# Patient Record
Sex: Male | Born: 1995 | Race: Black or African American | Hispanic: No | Marital: Single | Smoking: Current some day smoker
Health system: Southern US, Community
[De-identification: ages and names within clinical notes are randomized; demographics above are authoritative.]

---
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
## 2017-01-12 ENCOUNTER — Other Ambulatory Visit: Payer: Self-pay

## 2017-01-12 ENCOUNTER — Encounter (HOSPITAL_COMMUNITY): Payer: Self-pay | Admitting: Emergency Medicine

## 2017-01-12 DIAGNOSIS — Y929 Unspecified place or not applicable: Secondary | ICD-10-CM | POA: Insufficient documentation

## 2017-01-12 DIAGNOSIS — S39012A Strain of muscle, fascia and tendon of lower back, initial encounter: Secondary | ICD-10-CM | POA: Insufficient documentation

## 2017-01-12 DIAGNOSIS — Y939 Activity, unspecified: Secondary | ICD-10-CM | POA: Insufficient documentation

## 2017-01-12 DIAGNOSIS — Y999 Unspecified external cause status: Secondary | ICD-10-CM | POA: Insufficient documentation
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
## 2017-01-13 ENCOUNTER — Emergency Department (HOSPITAL_COMMUNITY)
Admission: EM | Admit: 2017-01-13 | Discharge: 2017-01-13 | Disposition: A | Discharge status: Home / Self Care | Payer: Self-pay | Attending: Emergency Medicine | Admitting: Emergency Medicine

## 2017-01-13 DIAGNOSIS — T148XXA Other injury of unspecified body region, initial encounter: Secondary | ICD-10-CM

## 2017-01-13 MED ORDER — IBUPROFEN 800 MG PO TABS
800.0000 mg | ORAL_TABLET | Freq: Once | ORAL | Status: AC
  Administered 2017-01-13: 800 mg via ORAL
  Filled 2017-01-13: qty 1
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
## 2019-01-20 ENCOUNTER — Emergency Department (HOSPITAL_COMMUNITY): Payer: Self-pay

## 2019-01-20 ENCOUNTER — Emergency Department (HOSPITAL_COMMUNITY)
Admission: EM | Admit: 2019-01-20 | Discharge: 2019-01-20 | Disposition: A | Discharge status: Home / Self Care | Payer: Self-pay | Attending: Emergency Medicine | Admitting: Emergency Medicine

## 2019-01-20 ENCOUNTER — Other Ambulatory Visit: Payer: Self-pay

## 2019-01-20 DIAGNOSIS — R05 Cough: Secondary | ICD-10-CM | POA: Insufficient documentation

## 2019-01-20 DIAGNOSIS — Z20822 Contact with and (suspected) exposure to covid-19: Secondary | ICD-10-CM

## 2019-01-20 DIAGNOSIS — Z20828 Contact with and (suspected) exposure to other viral communicable diseases: Secondary | ICD-10-CM | POA: Insufficient documentation

## 2019-01-20 DIAGNOSIS — R0602 Shortness of breath: Secondary | ICD-10-CM | POA: Insufficient documentation

## 2019-01-20 LAB — POC SARS CORONAVIRUS 2 AG -  ED: SARS Coronavirus 2 Ag: NEGATIVE

## 2019-01-20 NOTE — ED Provider Notes (Signed)
[facility] DEPT Provider Note   CSN: [id number] Arrival date & time: 01/20/19  7169     History   Chief Complaint Chief Complaint  Patient presents with  . Shortness of Breath    HPI Frank Brennan is a 23 y.o. male no known past medical history presents to emergency department today with chief complaint of shortness of breath x days.  He states he was recently around his friend who tested positive for Covid.  He is also endorsing a nonproductive cough.  He has not taken any medications for symptoms prior to arrival.  He admits to feeling short of breath after long coughing episodes.  He is requesting Covid test today.  Denies fever, chills, congestion, chest pain, abdominal pain, nausea, vomiting, urinary symptoms, diarrhea, rash, neck pain.   No past medical history on file.  There are no active problems to display for this patient.   No past surgical history on file.      Home Medications    Prior to Admission medications   Not on File    Family History No family history on file.  Social History Social History   Tobacco Use  . Smoking status: Never Smoker  . Smokeless tobacco: Never Used  Substance Use Topics  . Alcohol use: Yes    Comment: socially  . Drug use: No     Allergies   Patient has no known allergies.   Review of Systems Review of Systems All other systems are reviewed and are negative for acute change except as noted in the HPI.   Physical Exam Updated Vital Signs BP (!) 142/90 (BP Location: Left Arm)   Pulse 94   Temp 97.9 F (36.6 C) (Oral)   Resp 17   SpO2 97%   Physical Exam Vitals signs and nursing note reviewed.  Constitutional:      General: He is not in acute distress.    Appearance: He is not ill-appearing.  HENT:     Head: Normocephalic and atraumatic.     Right Ear: Tympanic membrane and external ear normal.     Left Ear: Tympanic membrane and external ear normal.     Nose: Nose  normal.     Mouth/Throat:     Mouth: Mucous membranes are moist.     Pharynx: Oropharynx is clear.  Eyes:     General: No scleral icterus.       Right eye: No discharge.        Left eye: No discharge.     Conjunctiva/sclera: Conjunctivae normal.  Neck:     Musculoskeletal: Normal range of motion.     Vascular: No JVD.  Cardiovascular:     Rate and Rhythm: Normal rate and regular rhythm.     Pulses: Normal pulses.          Radial pulses are 2+ on the right side and 2+ on the left side.     Heart sounds: Normal heart sounds.  Pulmonary:     Comments: Lungs clear to auscultation in all fields. Symmetric chest rise. No wheezing, rales, or rhonchi.  He is speaking in full sentences, no accessory muscle use.  SPO2 is 97% on room air. Chest:     Chest wall: No tenderness.  Abdominal:     Comments: Abdomen is soft, non-distended, and non-tender in all quadrants. No rigidity, no guarding. No peritoneal signs.  Musculoskeletal: Normal range of motion.  Skin:    General: Skin is warm and dry.  Capillary Refill: Capillary refill takes less than 2 seconds.  Neurological:     Mental Status: He is oriented to person, place, and time.     GCS: GCS eye subscore is 4. GCS verbal subscore is 5. GCS motor subscore is 6.     Comments: Fluent speech, no facial droop.  Psychiatric:        Behavior: Behavior normal.      ED Treatments / Results  Labs (all labs ordered are listed, but only abnormal results are displayed) Labs Reviewed  NOVEL CORONAVIRUS, NAA (HOSP ORDER, SEND-OUT TO REF LAB; TAT 18-24 HRS)  POC SARS CORONAVIRUS 2 AG -  ED    EKG None  Radiology Dg Chest Portable 1 View  Result Date: 01/20/2019 CLINICAL DATA:  Cough.  Shortness of breath.  Recent COVID exposure. EXAM: PORTABLE CHEST 1 VIEW COMPARISON:  None. FINDINGS: The heart size and mediastinal contours are within normal limits. Both lungs are clear. The visualized skeletal structures are unremarkable. IMPRESSION: No  active disease. Electronically Signed   By: Danae Orleans M.D.   On: 01/20/2019 11:19    Procedures Procedures (including critical care time)  Medications Ordered in ED Medications - No data to display   Initial Impression / Assessment and Plan / ED Course  I have reviewed the triage vital signs and the nursing notes.  Pertinent labs & imaging results that were available during my care of the patient were reviewed by me and considered in my medical decision making (see chart for details).  I have reviewed patient's EMR to obtain pertinent PMH to assist in MDM.  Symptoms and exam most suggestive of uncomplicated viral illness. DDX incluldes viral URI/LRI, COVID-19.  No travel. No known exposures to confirmed COVID-19.    Exam is benign.  Normal WOB. No fever, tachypnea, tachycardia, hypoxemia. Lungs are CTAB. I viewed pt's chest xray and it does not suggest acute infectious processes He has no significant h/o immunocompromise. Doubt bacterial bronchitis or pneumonia.  No signs or symptoms to suggest strep pharyngitis.  No clinical signs of severe illness, dehydration, to warrant further emergent work up in ER.  Patient ambulated without respiratory distress, SpO2 stayed above 95% on room air during ambulation trial. POC Covid test is negative.  Discussed with patient this could be false negative and send out test was performed.  Patient aware he needs to self quarantine until he has the test results.  Given reassuring physical exam, symptoms, will discharge with symptomatic treatment. Recommended PCP f/u in the next 2-3 days for persistent symptoms. Self-isolation instructions discussed. Pt was given home self-isolation instructions and instructions for family members. Pt understands signs and symptoms that would warrant return to ED.  Pt comfortable and agreeable with POC.   Portions of this note were generated with Scientist, clinical (histocompatibility and immunogenetics). Dictation errors may occur despite best attempts  at proofreading.   Final Clinical Impressions(s) / ED Diagnoses   Final diagnoses:  Shortness of breath  Close exposure to COVID-19 virus    ED Discharge Orders    None       Kathyrn Lass 01/20/19 1212    Terrilee Files, MD 01/20/19 1721

## 2019-01-20 NOTE — ED Triage Notes (Signed)
Pt presents with c/o request for Covid test. Pt reports he was around his friend recently who just tested positive for Covid. Pt reports some shortness of breath and a non-productive cough. Pt denies any pain.

## 2019-01-20 NOTE — Discharge Instructions (Addendum)
Thank you for allowing [place] to care for you today.   Please return to the emergency department if you have any new or worsening symptoms.  Your rapid Covid test today was negative.  As we discussed this could be a false negative.  A send out PCR test was performed and the result would be available in 24 hours.  You should continue to self isolate and self quarantine until you have the test result.   If you test positive:  Medications- You can take medications to help treat your symptoms: -Tylenol for fever and body aches. Please take as prescribed on the bottle. -Over the coutner cough medicine such as mucinex, robitussin, or other brands. -Flonase for nasal congestion  Treatment- This is a virus and unfortunately there are no antibitotics approved to treat this virus at this time. It is important to monitor your symptoms closely: -You should have a theremometer at home to check your temperature when feeling feverish. -Use a pulse ox meter to measure your oxygen when feeling short of breath.  -If your fever is over 100.4 despite taking tylenol or if your oxygen level drops below 94% these are reasons to rturn to the emergency department for further evaluation. Please call the emergency department before you come to make [place] aware.    We recommend you self-isolate for 10 days and to inform your work/family/friends that you has the virus.  They will need to self-quarantine for 14 days to monitor for symptoms.    Again: symptoms of shortnessf breath, chest pain, difficulty breathing, new onset of confuison, any symptoms that are concerning. And you or the person should come to emergency department for evaluation.   I hope you feel better soon

## 2019-01-20 NOTE — ED Notes (Signed)
Patient ambulated in room, oxygen saturation ranged from 95-99% on room air, no shortness of breath, no issues with ambulation.

## 2019-01-21 LAB — NOVEL CORONAVIRUS, NAA (HOSP ORDER, SEND-OUT TO REF LAB; TAT 18-24 HRS): SARS-CoV-2, NAA: NOT DETECTED

## 2019-09-23 ENCOUNTER — Ambulatory Visit
Admission: EM | Admit: 2019-09-23 | Discharge: 2019-09-23 | Disposition: A | Discharge status: Home / Self Care | Payer: Self-pay | Attending: Emergency Medicine | Admitting: Emergency Medicine

## 2019-09-23 ENCOUNTER — Other Ambulatory Visit: Payer: Self-pay

## 2019-09-23 DIAGNOSIS — Z113 Encounter for screening for infections with a predominantly sexual mode of transmission: Secondary | ICD-10-CM | POA: Insufficient documentation

## 2019-09-23 DIAGNOSIS — N5089 Other specified disorders of the male genital organs: Secondary | ICD-10-CM | POA: Insufficient documentation

## 2019-09-23 NOTE — Discharge Instructions (Addendum)
STI testing pending: No intercourse for the next 2 days. We will treat if indicated. Very important to follow-up with PCP for further evaluation. Return sooner for sudden onset of testicle swelling or pain, discharge.

## 2019-09-23 NOTE — ED Provider Notes (Signed)
[facility]    CSN: [id number] Arrival date & time: 09/23/19  0907      History   Chief Complaint Chief Complaint  Patient presents with  . knot on lt testicle    HPI Frank Brennan is a 24 y.o. male presenting for left testicle not.  States he noticed it last night.  No family or personal history of testicular cancer.  Denies testicle pain, penile pain or discharge, change in urinary or bowel habit.  No fever.  Patient currently sexually active with 1 male partner.   History reviewed. No pertinent past medical history.  There are no problems to display for this patient.   History reviewed. No pertinent surgical history.     Home Medications    Prior to Admission medications   Not on File    Family History History reviewed. No pertinent family history.  Social History Social History   Tobacco Use  . Smoking status: Never Smoker  . Smokeless tobacco: Never Used  Substance Use Topics  . Alcohol use: Yes    Comment: socially  . Drug use: No     Allergies   Patient has no known allergies.   Review of Systems As per HPI   Physical Exam Triage Vital Signs ED Triage Vitals  Enc Vitals Group     BP      Pulse      Resp      Temp      Temp src      SpO2      Weight      Height      Head Circumference      Peak Flow      Pain Score      Pain Loc      Pain Edu?      Excl. in GC?    No data found.  Updated Vital Signs BP (!) 151/107 (BP Location: Left Arm)   Pulse 77   Temp 98.1 F (36.7 C) (Oral)   Resp 18   SpO2 97%   Visual Acuity Right Eye Distance:   Left Eye Distance:   Bilateral Distance:    Right Eye Near:   Left Eye Near:    Bilateral Near:     Physical Exam Constitutional:      General: He is not in acute distress. HENT:     Head: Normocephalic and atraumatic.  Eyes:     General: No scleral icterus.    Pupils: Pupils are equal, round, and reactive to light.  Cardiovascular:     Rate and Rhythm:  Normal rate.  Pulmonary:     Effort: Pulmonary effort is normal. No respiratory distress.     Breath sounds: No wheezing.  Genitourinary:    Penis: Normal.      Comments: Testes symmetric bilaterally, nontender.  Patient does have < 1 cm nodule superiorly.  Nontender, nonmobile. Skin:    Coloration: Skin is not jaundiced or pale.  Neurological:     Mental Status: He is alert and oriented to person, place, and time.      UC Treatments / Results  Labs (all labs ordered are listed, but only abnormal results are displayed) Labs Reviewed  CYTOLOGY, (ORAL, ANAL, URETHRAL) ANCILLARY ONLY    EKG   Radiology No results found.  Procedures Procedures (including critical care time)  Medications Ordered in UC Medications - No data to display  Initial Impression / Assessment and Plan / UC Course  I have reviewed  the triage vital signs and the nursing notes.  Pertinent labs & imaging results that were available during my care of the patient were reviewed by me and considered in my medical decision making (see chart for details).     Patient appears well in office.  Cytology pending: We will treat if indicated.  Will have patient follow with PCP for outpatient evaluation/imaging.  Return precautions discussed, pt verbalized understanding and is agreeable to plan. Final Clinical Impressions(s) / UC Diagnoses   Final diagnoses:  Screening examination for venereal disease  Mass of left testicle     Discharge Instructions     STI testing pending: No intercourse for the next 2 days. We will treat if indicated. Very important to follow-up with PCP for further evaluation. Return sooner for sudden onset of testicle swelling or pain, discharge.    ED Prescriptions    None     PDMP not reviewed this encounter.   Hall[place]Potvin, [place], [place] 09/23/19 1011

## 2019-09-23 NOTE — ED Triage Notes (Signed)
Pt states noticed a knot to lt testicle last night. Denies pain or urinary difficulties.

## 2019-09-24 LAB — CYTOLOGY, (ORAL, ANAL, URETHRAL) ANCILLARY ONLY
Chlamydia: NEGATIVE
Comment: NEGATIVE
Comment: NEGATIVE
Comment: NORMAL
Neisseria Gonorrhea: NEGATIVE
Trichomonas: NEGATIVE

## 2019-11-11 ENCOUNTER — Ambulatory Visit: Admit: 2019-11-11 | Discharge status: Absent without leave | Payer: Self-pay

## 2019-11-22 NOTE — Patient Instructions (Signed)
Thank you for choosing Primary Care at [facility] to be your medical home!    Frank Brennan was seen by De Hollingshead, DO today.   Frank Brennan's primary care provider is Marcy Siren, DO.   For the best care possible, you should try to see Marcy Siren, DO whenever you come to the clinic.   We look forward to seeing you again soon!  If you have any questions about your visit today, please call us at [phone] or feel free to reach your primary care provider via MyChart.

## 2019-11-25 ENCOUNTER — Telehealth (INDEPENDENT_AMBULATORY_CARE_PROVIDER_SITE_OTHER): Payer: 59 | Admitting: Internal Medicine

## 2019-11-25 ENCOUNTER — Encounter: Payer: Self-pay | Admitting: Internal Medicine

## 2019-11-25 DIAGNOSIS — N5089 Other specified disorders of the male genital organs: Secondary | ICD-10-CM | POA: Diagnosis not present

## 2019-11-25 DIAGNOSIS — Z7689 Persons encountering health services in other specified circumstances: Secondary | ICD-10-CM | POA: Diagnosis not present

## 2019-11-25 NOTE — Progress Notes (Signed)
Virtual Visit via Telephone Note  I connected with Frank Brennan, on 11/25/2019 at 10:11 AM by telephone due to the COVID-19 pandemic and verified that I am speaking with the correct person using two identifiers.   Consent: I discussed the limitations, risks, security and privacy concerns of performing an evaluation and management service by telephone and the availability of in person appointments. I also discussed with the patient that there may be a patient responsible charge related to this service. The patient expressed understanding and agreed to proceed.   Location of Patient: Home   Location of Provider: Clinic   Persons participating in Telemedicine visit: Reinhardt Ronn Smolinsky [facility] Dr. Earlene Plater   History of Present Illness: Patient has a visit to establish care. 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 No lab work was completed at this time.   No PMH. No surgical history. Not currently taking any medications.   Patient reports has concerns about a scrotum knot. Feels like its in the inside and attached to his testicle. Caught his attention 3 months ago. Feels like it has gotten smaller but is still there. Not painful.    No past medical history on file. No Known Allergies  No current outpatient medications on file prior to visit.   No current facility-administered medications on file prior to visit.    Observations/Objective: NAD. Speaking clearly.  Work of breathing normal.  Alert and oriented. Mood appropriate.   Assessment and Plan: 1. Encounter to establish care Reviewed patient's PMH, social history, surgical history, and medications.  Is overdue for annual exam, screening blood work, and health maintenance topics. Have asked patient to return for visit to address these items.   2. Scrotal mass Non-acute scrotal mass warrants work up with ultrasound to distinguish amongst common  causes of non-painful scrotal masses.  - US Scrotum; Future   Follow Up Instructions: Ultrasound and annual exam    I discussed the assessment and treatment plan with the patient. The patient was provided an opportunity to ask questions and all were answered. The patient agreed with the plan and demonstrated an understanding of the instructions.   The patient was advised to call back or seek an in-person evaluation if the symptoms worsen or if the condition fails to improve as anticipated.     I provided 14 minutes total of non-face-to-face time during this encounter including median intraservice time, reviewing previous notes, investigations, ordering medications, medical decision making, coordinating care and patient verbalized understanding at the end of the visit.    Marcy Siren, D.O. Primary Care at [facility]  11/25/2019, 10:11 AM

## 2019-12-03 ENCOUNTER — Other Ambulatory Visit: Payer: 59

## 2019-12-13 ENCOUNTER — Other Ambulatory Visit: Payer: 59

## 2019-12-27 ENCOUNTER — Other Ambulatory Visit: Payer: 59

## 2019-12-30 ENCOUNTER — Ambulatory Visit
Admission: RE | Admit: 2019-12-30 | Discharge: 2019-12-30 | Disposition: A | Discharge status: Home / Self Care | Payer: 59 | Source: Ambulatory Visit | Attending: Internal Medicine | Admitting: Internal Medicine

## 2019-12-30 DIAGNOSIS — N5089 Other specified disorders of the male genital organs: Secondary | ICD-10-CM

## 2020-05-21 ENCOUNTER — Other Ambulatory Visit: Payer: Self-pay

## 2020-05-21 ENCOUNTER — Ambulatory Visit
Admission: RE | Admit: 2020-05-21 | Discharge: 2020-05-21 | Disposition: A | Discharge status: Home / Self Care | Payer: Self-pay | Source: Ambulatory Visit | Attending: Physician Assistant | Admitting: Physician Assistant

## 2020-05-21 VITALS — BP 136/81 | HR 73 | Temp 98.2°F | Resp 22

## 2020-05-21 DIAGNOSIS — J0100 Acute maxillary sinusitis, unspecified: Secondary | ICD-10-CM

## 2020-05-21 DIAGNOSIS — R0981 Nasal congestion: Secondary | ICD-10-CM

## 2020-05-21 MED ORDER — CETIRIZINE HCL 10 MG PO TABS: 10.0000 mg | ORAL_TABLET | Freq: Every day | ORAL | 0 refills | Status: DC

## 2020-05-21 MED ORDER — AMOXICILLIN-POT CLAVULANATE 875-125 MG PO TABS: 1.0000 | ORAL_TABLET | Freq: Two times a day (BID) | ORAL | 0 refills | Status: DC

## 2020-05-21 MED ORDER — FLUTICASONE PROPIONATE 50 MCG/ACT NA SUSP
1.0000 | Freq: Every day | NASAL | 0 refills | Status: DC
Start: 2020-05-21 — End: 2022-02-08

## 2020-05-21 NOTE — ED Triage Notes (Signed)
Pt c/o lt nare congested and swelling for over a month.

## 2020-05-21 NOTE — ED Provider Notes (Signed)
[facility]    CSN: [id number] Arrival date & time: 05/21/20  1706      History   Chief Complaint Chief Complaint  Patient presents with  . appt 5  . Nasal Congestion    HPI Frank Brennan is a 25 y.o. male.   Frank Brennan presents today with a month long history of nasal congestion. He reports associated sinus pressure, drainage, headache. Denies any fever, cough, nausea/ vomiting, chest pain, shortness of breath. He has a history of allergies but has not been taking medication recently. He denies recent illness. He denies history of asthma or COPD. He has not tried any over the counter medications. Denies recent antibiotic use. He has not had flu or COVID vaccinations. He has not seen ENT in the past. Reports symptoms are primarily on the left side and he presents for evaluation as they are worsening.      History reviewed. No pertinent past medical history.  There are no problems to display for this patient.   History reviewed. No pertinent surgical history.     Home Medications    Prior to Admission medications   Medication Sig Start Date End Date Taking? Authorizing Provider  amoxicillin-clavulanate (AUGMENTIN) 875-125 MG tablet Take 1 tablet by mouth every 12 (twelve) hours. 05/21/20  Yes Quenisha Lovins, Noberto Retort, PA-C  cetirizine (ZYRTEC ALLERGY) 10 MG tablet Take 1 tablet (10 mg total) by mouth daily. 05/21/20  Yes Denean Pavon K, PA-C  fluticasone (FLONASE) 50 MCG/ACT nasal spray Place 1 spray into both nostrils daily. 05/21/20  Yes Aiana Nordquist, Noberto Retort, PA-C    Family History History reviewed. No pertinent family history.  Social History Social History   Tobacco Use  . Smoking status: Never Smoker  . Smokeless tobacco: Never Used  Substance Use Topics  . Alcohol use: Yes    Comment: socially  . Drug use: No     Allergies   Patient has no known allergies.   Review of Systems Review of Systems  Constitutional: Negative for activity change, appetite  change, fatigue and fever.  HENT: Positive for congestion, postnasal drip and sinus pressure. Negative for sneezing and sore throat.   Respiratory: Negative for cough and shortness of breath.   Cardiovascular: Negative for chest pain.  Gastrointestinal: Negative for abdominal pain, diarrhea, nausea and vomiting.  Neurological: Positive for headaches. Negative for dizziness and light-headedness.     Physical Exam Triage Vital Signs ED Triage Vitals  Enc Vitals Group     BP 05/21/20 1719 136/81     Pulse Rate 05/21/20 1719 73     Resp 05/21/20 1719 (!) 22     Temp 05/21/20 1719 98.2 F (36.8 C)     Temp Source 05/21/20 1719 Oral     SpO2 05/21/20 1719 94 %     Weight --      Height --      Head Circumference --      Peak Flow --      Pain Score 05/21/20 1736 0     Pain Loc --      Pain Edu? --      Excl. in GC? --    No data found.  Updated Vital Signs BP 136/81 (BP Location: Right Arm)   Pulse 73   Temp 98.2 F (36.8 C) (Oral)   Resp (!) 22   SpO2 94%   Visual Acuity Right Eye Distance:   Left Eye Distance:   Bilateral Distance:    Right Eye  Near:   Left Eye Near:    Bilateral Near:     Physical Exam Vitals reviewed.  Constitutional:      General: He is awake.     Appearance: Normal appearance. He is normal weight. He is not ill-appearing.     Comments: Very pleasant male appears stated age in no acute distress.   HENT:     Head: Normocephalic and atraumatic.     Right Ear: Tympanic membrane, ear canal and external ear normal. Tympanic membrane is not erythematous or bulging.     Left Ear: Tympanic membrane, ear canal and external ear normal. Tympanic membrane is not erythematous or bulging.     Nose: Mucosal edema and congestion present. No rhinorrhea.     Left Nostril: Occlusion present. No foreign body.     Right Sinus: No maxillary sinus tenderness or frontal sinus tenderness.     Left Sinus: Maxillary sinus tenderness present. No frontal sinus  tenderness.     Mouth/Throat:     Pharynx: Uvula midline. No posterior oropharyngeal erythema or uvula swelling.  Cardiovascular:     Rate and Rhythm: Normal rate and regular rhythm.     Heart sounds: No murmur heard.   Pulmonary:     Effort: Pulmonary effort is normal. No accessory muscle usage or respiratory distress.     Breath sounds: Normal breath sounds. No stridor. No wheezing, rhonchi or rales.     Comments: Clear to auscultation bilaterally  Lymphadenopathy:     Head:     Right side of head: No submental, submandibular or tonsillar adenopathy.     Left side of head: No submental, submandibular or tonsillar adenopathy.     Cervical: No cervical adenopathy.  Neurological:     Mental Status: He is alert.  Psychiatric:        Behavior: Behavior is cooperative.      UC Treatments / Results  Labs (all labs ordered are listed, but only abnormal results are displayed) Labs Reviewed - No data to display  EKG   Radiology No results found.  Procedures Procedures (including critical care time)  Medications Ordered in UC Medications - No data to display  Initial Impression / Assessment and Plan / UC Course  I have reviewed the triage vital signs and the nursing notes.  Pertinent labs & imaging results that were available during my care of the patient were reviewed by me and considered in my medical decision making (see chart for details).     Patient started on Augmentin given prolonged and worsening symptoms. Recommended he use over the counter medication for additional symptoms including antihistamine, Flonase, Mucinex. He does not need a work note. Discussed that if symptoms persist we would need to consider seeing ENT and/or imaging such as CT sinus but this would need to be arranged by his PCP. Strict return precautions given to which patient expressed understanding.   Final Clinical Impressions(s) / UC Diagnoses   Final diagnoses:  Acute non-recurrent maxillary  sinusitis  Nasal congestion     Discharge Instructions     Take medication as prescribed. I would also recommend Mucinex. Make sure you are drinking plenty of water. If anything worsens please come see [place] again. If you are not getting better soon I would reach out to your PCP to consider ENT referral as we discussed.     ED Prescriptions    Medication Sig Dispense Auth. Provider   amoxicillin-clavulanate (AUGMENTIN) 875-125 MG tablet Take 1 tablet by mouth every  12 (twelve) hours. 14 tablet Verba Ainley K, PA-C   fluticasone (FLONASE) 50 MCG/ACT nasal spray Place 1 spray into both nostrils daily. 16 g Dmiya Malphrus K, PA-C   cetirizine (ZYRTEC ALLERGY) 10 MG tablet Take 1 tablet (10 mg total) by mouth daily. 30 tablet Clemmie Buelna, Noberto Retort, PA-C     PDMP not reviewed this encounter.   Jeani Hawking, PA-C 05/21/20 1803

## 2020-05-21 NOTE — Discharge Instructions (Addendum)
Take medication as prescribed. I would also recommend Mucinex. Make sure you are drinking plenty of water. If anything worsens please come see [place] again. If you are not getting better soon I would reach out to your PCP to consider ENT referral as we discussed.

## 2022-02-08 ENCOUNTER — Ambulatory Visit
Admission: RE | Admit: 2022-02-08 | Discharge: 2022-02-08 | Disposition: A | Discharge status: Home / Self Care | Payer: Self-pay | Source: Ambulatory Visit | Attending: Family Medicine | Admitting: Family Medicine

## 2022-02-08 VITALS — BP 129/83 | HR 74 | Temp 97.5°F | Resp 16

## 2022-02-08 DIAGNOSIS — Z113 Encounter for screening for infections with a predominantly sexual mode of transmission: Secondary | ICD-10-CM | POA: Insufficient documentation

## 2022-02-08 NOTE — ED Triage Notes (Signed)
Pt presents with request for sti testing no symptoms or known exposures

## 2022-02-08 NOTE — Discharge Instructions (Signed)
We have sent testing for sexually transmitted infections. We will notify you of any positive results once they are received. If required, we will prescribe any medications you might need.  Please refrain from all sexual activity for at least the next seven days.  

## 2022-02-08 NOTE — ED Provider Notes (Signed)
  [facility]   [id number] 02/08/22 Arrival Time: 1249  ASSESSMENT & PLAN:  1. Screening for STDs (sexually transmitted diseases)       Discharge Instructions      We have sent testing for sexually transmitted infections. We will notify you of any positive results once they are received. If required, we will prescribe any medications you might need.  Please refrain from all sexual activity for at least the next seven days.     Pending: Labs Reviewed  RPR  HIV ANTIBODY (ROUTINE TESTING W REFLEX)  CYTOLOGY, (ORAL, ANAL, URETHRAL) ANCILLARY ONLY    Will notify of any positive results. Instructed to refrain from sexual activity for at least seven days.  Reviewed expectations re: course of current medical issues. Questions answered. Outlined signs and symptoms indicating need for more acute intervention. Patient verbalized understanding. After Visit Summary given.   SUBJECTIVE:  Frank Brennan is a 26 y.o. male who requests STD screening.  No abdominal or pelvic pain. No n/v. No rashes or lesions. Reports that he is sexually active with multiple male partners (2).  OBJECTIVE:  Vitals:   02/08/22 1358  BP: 129/83  Pulse: 74  Resp: 16  Temp: (!) 97.5 F (36.4 C)  TempSrc: Oral  SpO2: 98%     General appearance: alert, cooperative, appears stated age and no distress GU: deferred Skin: warm and dry Psychological: alert and cooperative; normal mood and affect.    Labs Reviewed  RPR  HIV ANTIBODY (ROUTINE TESTING W REFLEX)  CYTOLOGY, (ORAL, ANAL, URETHRAL) ANCILLARY ONLY    No Known Allergies  History reviewed. No pertinent past medical history. History reviewed. No pertinent family history. Social History   Socioeconomic History   Marital status: Single    Spouse name: Not on file   Number of children: Not on file   Years of education: Not on file   Highest education level: Not on file  Occupational History   Not on file  Tobacco  Use   Smoking status: Some Days    Types: Cigars    Passive exposure: Never   Smokeless tobacco: Never  Vaping Use   Vaping Use: Never used  Substance and Sexual Activity   Alcohol use: Not Currently    Alcohol/week: 2.0 standard drinks of alcohol    Types: 2 Cans of beer per week    Comment: socially   Drug use: No   Sexual activity: Not on file  Other Topics Concern   Not on file  Social History Narrative   Not on file   Social Determinants of Health   Financial Resource Strain: Not on file  Food Insecurity: Not on file  Transportation Needs: Not on file  Physical Activity: Not on file  Stress: Not on file  Social Connections: Not on file  Intimate Partner Violence: Not on file           Mardella Layman, MD 02/08/22 1441

## 2022-02-09 LAB — CYTOLOGY, (ORAL, ANAL, URETHRAL) ANCILLARY ONLY
Chlamydia: NEGATIVE
Comment: NEGATIVE
Comment: NEGATIVE
Comment: NORMAL
Neisseria Gonorrhea: NEGATIVE
Trichomonas: NEGATIVE

## 2022-02-09 LAB — RPR: RPR Ser Ql: NONREACTIVE

## 2022-02-09 LAB — HIV ANTIBODY (ROUTINE TESTING W REFLEX): HIV Screen 4th Generation wRfx: NONREACTIVE

## 2022-10-07 IMAGING — US US SCROTUM W/ DOPPLER COMPLETE
1 series · 13 of 25 positions shown · non-contrast
Comparison: None

CLINICAL DATA: Right scrotal mass

EXAM:
SCROTAL ULTRASOUND
DOPPLER ULTRASOUND OF THE TESTICLES
TECHNIQUE: Complete ultrasound examination of the testicles, epididymis, and
other scrotal structures was performed. Color and spectral Doppler
ultrasound were also utilized to evaluate blood flow to the
testicles.

[Series 1: us scrotum w/ doppler complete · 0.05mm/px · 13 of 53 slices shown]
[im 1/53]
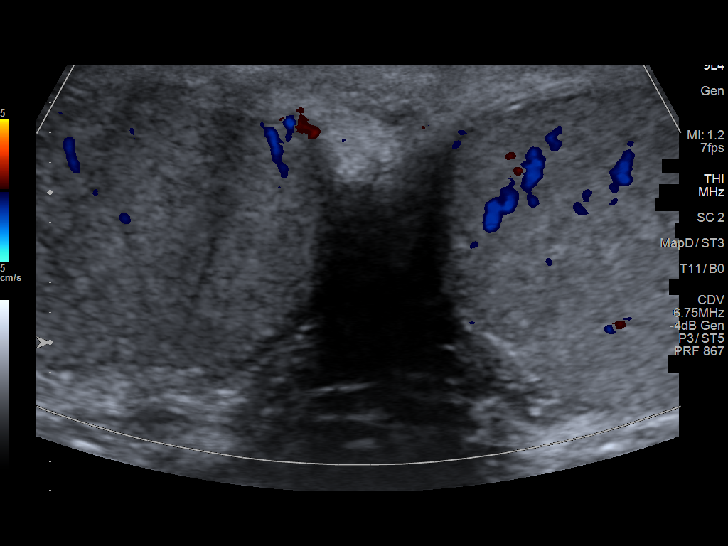
[im 5/53]
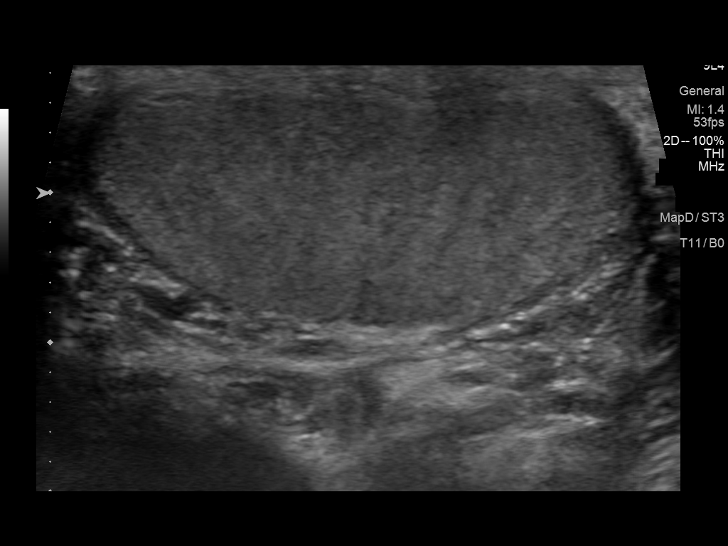
[im 9/53]
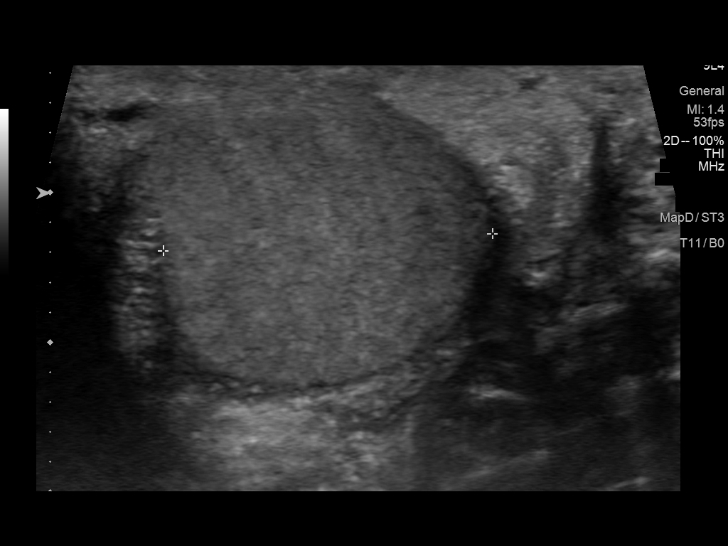
[im 14/53]
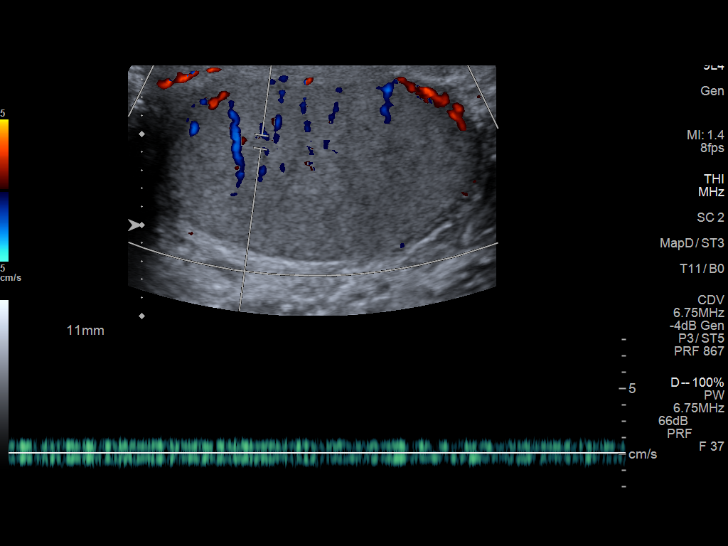
[im 18/53]
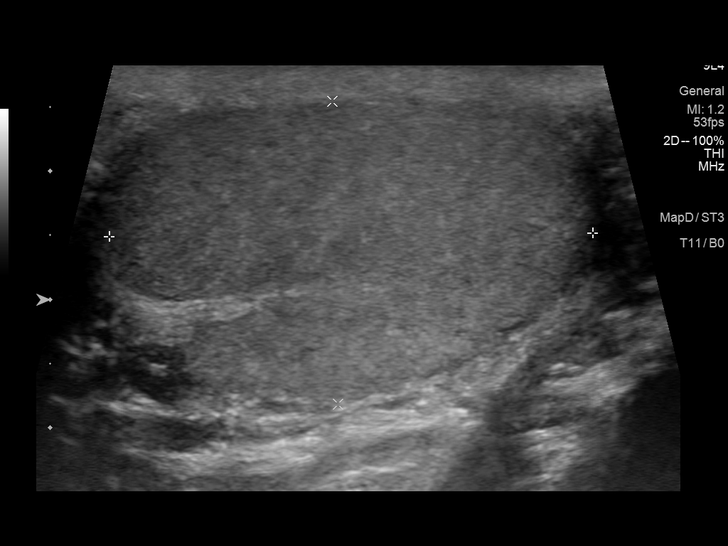
[im 22/53]
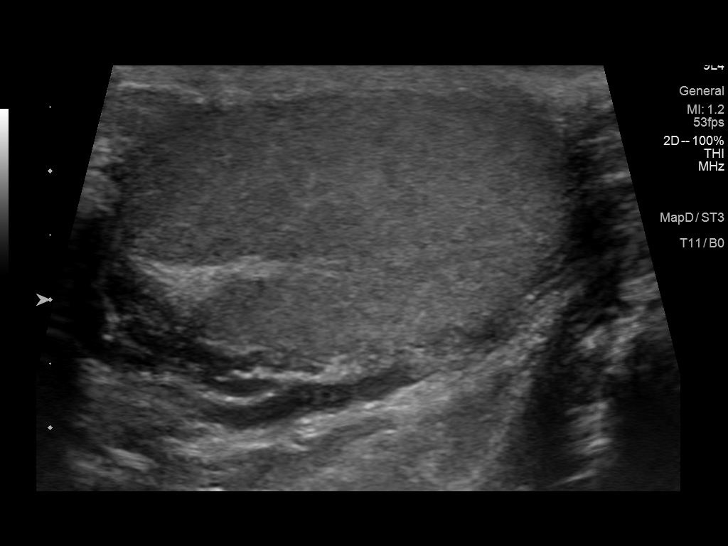
[im 27/53]
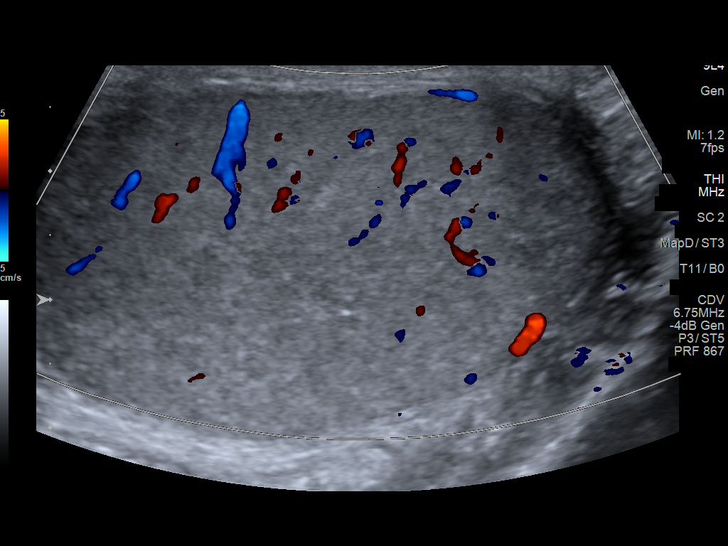
[im 31/53]
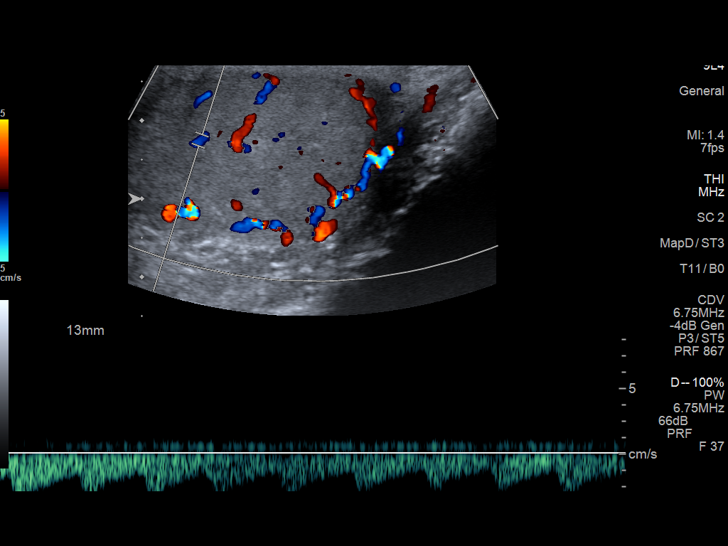
[im 35/53]
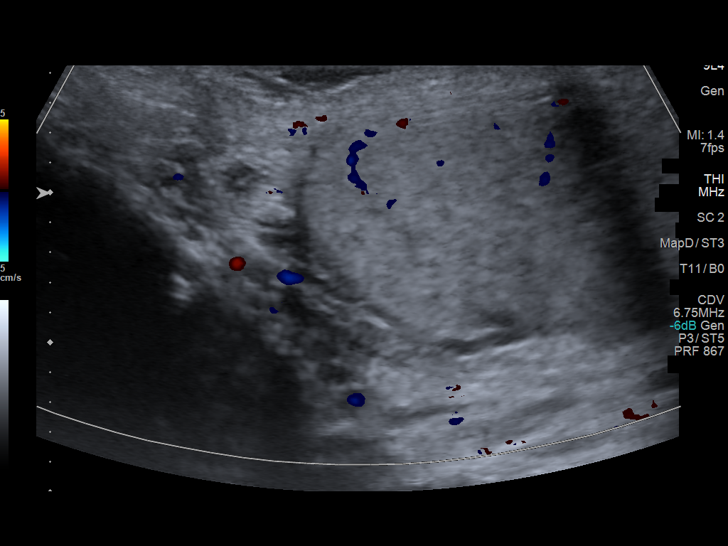
[im 40/53]
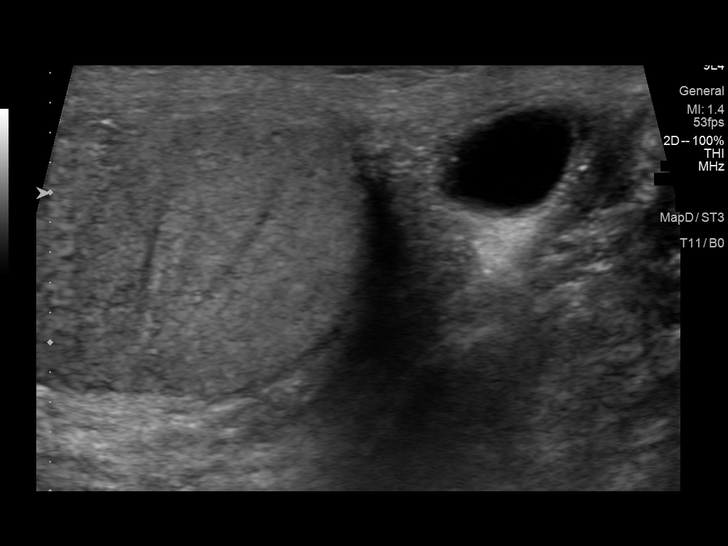
[im 44/53]
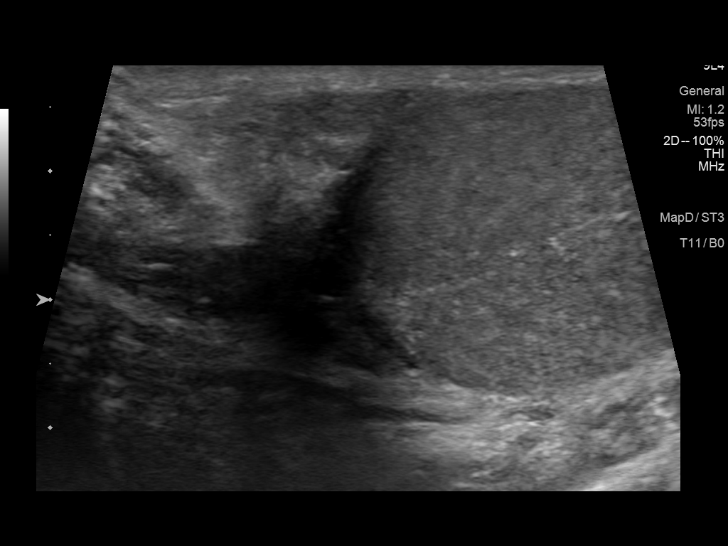
[im 48/53]
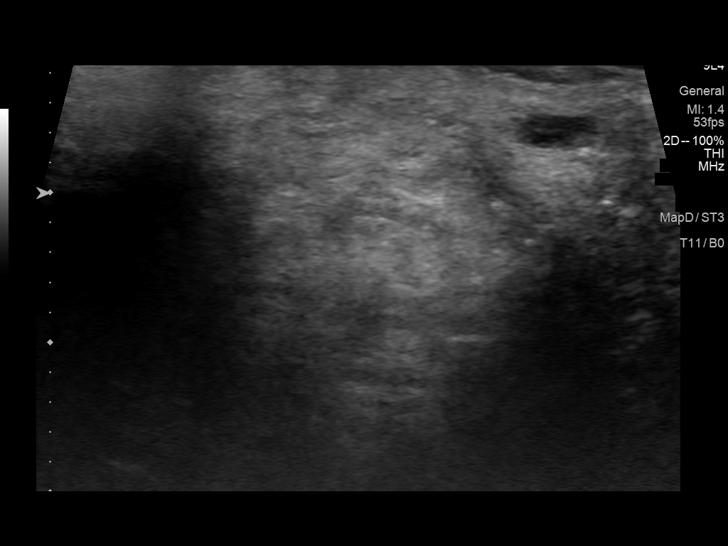
[im 53/53]
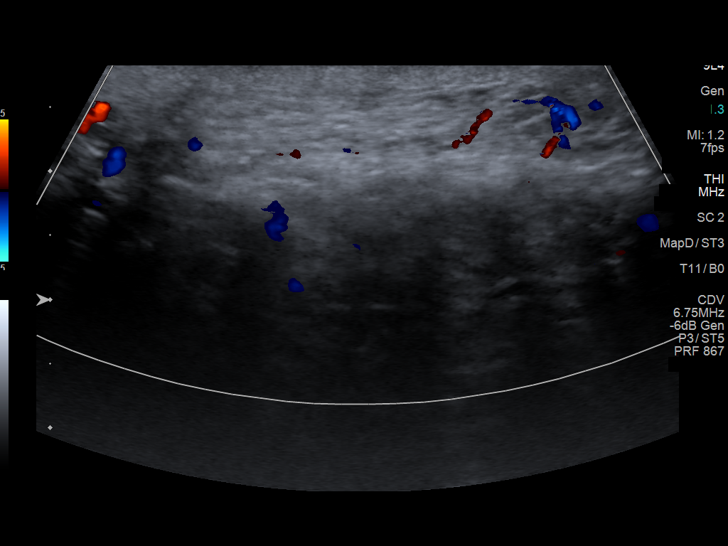

[13 of 25 positions shown; findings below may reference images not displayed]

FINDINGS: Right testicle

Measurements: 3.7 x 1.5 x 2.2 cm. Uniform parenchyma. No mass or
microlithiasis.

Left testicle

Measurements: 3.8 x 2.4 x 2.4 cm. Uniform parenchyma. No mass or
microlithiasis.

Right epididymis: Anechoic epididymal head cyst measuring up to
x 1.0 x 1.2 cm in size, likely corresponding to the patient's
palpable area of concern. Otherwise normal appearance of the
epididymis.

Left epididymis:  Normal in size and appearance.

Hydrocele:  None visualized.

Varicocele:  None visualized.

Pulsed Doppler interrogation of both testes demonstrates normal low
resistance arterial and venous waveforms bilaterally.
IMPRESSION: 1. 1.3 cm right epididymal head cyst likely corresponding to the
patient's palpable concern. Often a benign incidental finding.
2. Otherwise unremarkable scrotal ultrasound without concerning
testicular mass or torsion.

## 2024-01-28 ENCOUNTER — Ambulatory Visit
Admission: RE | Admit: 2024-01-28 | Discharge: 2024-01-28 | Disposition: A | Discharge status: Home / Self Care | Source: Ambulatory Visit | Attending: Physician Assistant

## 2024-01-28 ENCOUNTER — Other Ambulatory Visit: Payer: Self-pay

## 2024-01-28 VITALS — BP 111/58 | HR 71 | Temp 98.0°F | Resp 17 | Ht 69.0 in | Wt 195.0 lb

## 2024-01-28 DIAGNOSIS — Z113 Encounter for screening for infections with a predominantly sexual mode of transmission: Secondary | ICD-10-CM | POA: Diagnosis not present

## 2024-01-28 NOTE — ED Triage Notes (Signed)
 Pt presents to urgent care for STD testing. States it is precautionary. Is not experiencing any symptoms. Denies pain at this time. Requesting cytology swab + blood work on today's visit.

## 2024-01-28 NOTE — Discharge Instructions (Signed)
 You were seen today for concerns for STD exposure. We have completed a cytology swab to check for gonorrhea, chlamydia, and trichomonas. We have also drawn blood work to check for HIV and syphilis. We will keep you updated on these results once they are available and if any medications are indicated by this test results they will be sent in to the pharmacy on file or you will receive a call to set up an appointment for an injection here at urgent care. Please refrain from sexual activity until your test results are negative or until you have completed an appropriate medication regimen.  It is recommended that you use a condom or another barrier method to help prevent STD transmission going forward.  Please make sure that you are communicating your test results to your partners especially if there are any positive so that they can get requisite testing for themselves.

## 2024-01-28 NOTE — ED Provider Notes (Signed)
 [facility]    CSN: [id number] Arrival date & time: 01/28/24  1435      History   Chief Complaint Chief Complaint  Patient presents with   SEXUALLY TRANSMITTED DISEASE    Std testing. - Entered by patient    HPI Dutch Ing is a 28 y.o. male.  has no past medical history on file.   HPI  Pt is here today for STD screening. He is sexually active with single male partner who has not expressed concerns for STD exposure or symptoms to patient. He denies current symptoms today.   History reviewed. No pertinent past medical history.  There are no active problems to display for this patient.   History reviewed. No pertinent surgical history.     Home Medications    Prior to Admission medications  Not on File    Family History History reviewed. No pertinent family history.  Social History Social History[1]   Allergies   Patient has no known allergies.   Review of Systems Review of Systems  Constitutional:  Negative for chills and fever.  Genitourinary:  Negative for difficulty urinating, dysuria, flank pain, genital sores, penile discharge, penile pain, penile swelling, scrotal swelling and testicular pain.  Musculoskeletal:  Negative for myalgias.  Skin:  Negative for rash.     Physical Exam Triage Vital Signs ED Triage Vitals  Encounter Vitals Group     BP 01/28/24 1523 (!) 111/58     Girls Systolic BP Percentile --      Girls Diastolic BP Percentile --      Boys Systolic BP Percentile --      Boys Diastolic BP Percentile --      Pulse Rate 01/28/24 1523 71     Resp 01/28/24 1523 17     Temp 01/28/24 1523 98 F (36.7 C)     Temp Source 01/28/24 1523 Oral     SpO2 01/28/24 1523 97 %     Weight 01/28/24 1527 195 lb (88.5 kg)     Height 01/28/24 1527 5' 9 (1.753 m)     Head Circumference --      Peak Flow --      Pain Score 01/28/24 1526 0     Pain Loc --      Pain Education --      Exclude from Growth Chart --    No data  found.  Updated Vital Signs BP (!) 111/58 (BP Location: Right Arm)   Pulse 71   Temp 98 F (36.7 C) (Oral)   Resp 17   Ht 5' 9 (1.753 m)   Wt 195 lb (88.5 kg)   SpO2 97%   BMI 28.80 kg/m   Visual Acuity Right Eye Distance:   Left Eye Distance:   Bilateral Distance:    Right Eye Near:   Left Eye Near:    Bilateral Near:     Physical Exam Vitals reviewed.  Constitutional:      General: He is awake.     Appearance: Normal appearance. He is well-developed and well-groomed.  HENT:     Head: Normocephalic and atraumatic.  Eyes:     Extraocular Movements: Extraocular movements intact.     Conjunctiva/sclera: Conjunctivae normal.  Pulmonary:     Effort: Pulmonary effort is normal.  Musculoskeletal:     Cervical back: Normal range of motion.  Neurological:     Mental Status: He is alert and oriented to person, place, and time.  Psychiatric:  Attention and Perception: Attention normal.        Mood and Affect: Mood normal.        Speech: Speech normal.        Behavior: Behavior normal. Behavior is cooperative.        Thought Content: Thought content normal.        Judgment: Judgment normal.      UC Treatments / Results  Labs (all labs ordered are listed, but only abnormal results are displayed) Labs Reviewed  SYPHILIS: RPR W/REFLEX TO RPR TITER AND TREPONEMAL ANTIBODIES, TRADITIONAL SCREENING AND DIAGNOSIS ALGORITHM  HIV ANTIBODY (ROUTINE TESTING W REFLEX)  CYTOLOGY, (ORAL, ANAL, URETHRAL) ANCILLARY ONLY    EKG   Radiology No results found.  Procedures Procedures (including critical care time)  Medications Ordered in UC Medications - No data to display  Initial Impression / Assessment and Plan / UC Course  I have reviewed the triage vital signs and the nursing notes.  Pertinent labs & imaging results that were available during my care of the patient were reviewed by me and considered in my medical decision making (see chart for details).       Final Clinical Impressions(s) / UC Diagnoses   Final diagnoses:  Screening examination for STD (sexually transmitted disease)   Patient presents today for STD screening.  He denies recent symptoms suggestive of STD exposure or concern.  He reports that he is sexually active with single male partner and they have not expressed concerns for recent exposure or symptoms to him.  Patient denies penile discharge, pain or swelling in the penis or testicles, abdominal pain, dysuria, difficulty urinating, fever or chills.  Patient would like cytology swab and blood work for full assessment.  Cytology swab collected to assess for gonorrhea, chlamydia, trichomonas.  Blood work collected to assess for HIV and syphilis.  Reviewed with patient that he will be contacted by a result nurse should any of his testing have a positive result.  He was informed that he would receive further instructions should any of his test results require further management.  Reviewed that he would likely not be contacted if everything was negative but his results would be available immediately on MyChart for review.  Patient voices agreement understanding.  Follow-up as needed for indicated management following finalized test results    Discharge Instructions      You were seen today for concerns for STD exposure. We have completed a cytology swab to check for gonorrhea, chlamydia, and trichomonas. We have also drawn blood work to check for HIV and syphilis. We will keep you updated on these results once they are available and if any medications are indicated by this test results they will be sent in to the pharmacy on file or you will receive a call to set up an appointment for an injection here at urgent care. Please refrain from sexual activity until your test results are negative or until you have completed an appropriate medication regimen.  It is recommended that you use a condom or another barrier method to help prevent STD  transmission going forward.  Please make sure that you are communicating your test results to your partners especially if there are any positive so that they can get requisite testing for themselves.      ED Prescriptions   None    PDMP not reviewed this encounter.     [1]  Social History Tobacco Use   Smoking status: Some Days    Types: Cigars  Passive exposure: Never   Smokeless tobacco: Never  Vaping Use   Vaping status: Never Used  Substance Use Topics   Alcohol use: Not Currently    Alcohol/week: 2.0 standard drinks of alcohol    Types: 2 Cans of beer per week    Comment: socially   Drug use: No     Gregg Holster, [place] BRAVO, PA-C 01/28/24 1628

## 2024-01-29 LAB — CYTOLOGY, (ORAL, ANAL, URETHRAL) ANCILLARY ONLY
Chlamydia: NEGATIVE
Comment: NEGATIVE
Comment: NEGATIVE
Comment: NORMAL
Neisseria Gonorrhea: NEGATIVE
Trichomonas: NEGATIVE

## 2024-01-30 LAB — HIV ANTIBODY (ROUTINE TESTING W REFLEX): HIV Screen 4th Generation wRfx: NONREACTIVE

## 2024-01-30 LAB — SYPHILIS: RPR W/REFLEX TO RPR TITER AND TREPONEMAL ANTIBODIES, TRADITIONAL SCREENING AND DIAGNOSIS ALGORITHM: RPR Ser Ql: NONREACTIVE
